# Patient Record
Sex: Male | Born: 1995 | Race: Black or African American | Hispanic: No | Marital: Single | Smoking: Never smoker
Health system: Southern US, Community
[De-identification: ages and names within clinical notes are randomized; demographics above are authoritative.]

---

## 2016-02-17 ENCOUNTER — Emergency Department (HOSPITAL_COMMUNITY)
Admission: EM | Admit: 2016-02-17 | Discharge: 2016-02-17 | Disposition: A | Discharge status: Home / Self Care | Payer: Self-pay | Attending: Emergency Medicine | Admitting: Emergency Medicine

## 2016-02-17 ENCOUNTER — Emergency Department (HOSPITAL_COMMUNITY): Payer: Self-pay

## 2016-02-17 ENCOUNTER — Encounter (HOSPITAL_COMMUNITY): Payer: Self-pay | Admitting: Emergency Medicine

## 2016-02-17 DIAGNOSIS — M25521 Pain in right elbow: Secondary | ICD-10-CM | POA: Insufficient documentation

## 2016-02-17 DIAGNOSIS — Y999 Unspecified external cause status: Secondary | ICD-10-CM | POA: Insufficient documentation

## 2016-02-17 DIAGNOSIS — W19XXXA Unspecified fall, initial encounter: Secondary | ICD-10-CM | POA: Insufficient documentation

## 2016-02-17 DIAGNOSIS — Y929 Unspecified place or not applicable: Secondary | ICD-10-CM | POA: Insufficient documentation

## 2016-02-17 DIAGNOSIS — Y9367 Activity, basketball: Secondary | ICD-10-CM | POA: Insufficient documentation

## 2016-02-17 MED ORDER — ACETAMINOPHEN 325 MG PO TABS
650.0000 mg | ORAL_TABLET | Freq: Once | ORAL | Status: AC
  Administered 2016-02-17: 650 mg via ORAL
  Filled 2016-02-17: qty 2

## 2016-02-17 NOTE — Discharge Instructions (Signed)
Read the information below.  Your x-rays did not show any acute fracture or dislocation. An ace wrap and arm sling were provided for comfort. Ice area for 20 minute increments and elevate arm for the next 48-72 hours.  You can take tylenol 650mg  every 6hrs or motrin 400mg  every 6hrs for pain relief.   Follow up with your primary care doctor if symptoms persist. I have provided the contact information for [facility]: [facility], please call to establish care and for re-check.  You may return to the Emergency Department at any time for worsening condition or any new symptoms that concern you. Return if you develop fever, worsening pain, change in color to your fingers, or loss of sensation in your fingers.

## 2016-02-17 NOTE — ED Provider Notes (Signed)
[facility]-EMERGENCY DEPT Provider Note   CSN: [id number] Arrival date & time: 02/17/16  2058     History   Chief Complaint Chief Complaint  Patient presents with  . Arm Pain    HPI Austin Deleon is a 20 y.o. male.  Austin Deleon is a 20 y.o. male presents to ED with complaint of right elbow pain. Pt was playing basketball today when he went up for a rebound and subsequently landed on his right elbow. Pain is worse with movement and improved with rest. He has associated weakness. He denies fever, swelling, change in color, warmth, or numbness. Denies head injury. No pertinent medical conditions. No treatments tried PTA.       No past medical history on file.  There are no active problems to display for this patient.   No past surgical history on file.     Home Medications    Prior to Admission medications   Not on File    Family History No family history on file.  Social History Social History  Substance Use Topics  . Smoking status: Never Smoker  . Smokeless tobacco: Never Used  . Alcohol use Not on file     Allergies   Review of patient's allergies indicates not on file.   Review of Systems Review of Systems  Constitutional: Negative for fever.  Musculoskeletal: Positive for arthralgias.  Skin: Negative for color change and wound.  Neurological: Positive for weakness. Negative for numbness.     Physical Exam Updated Vital Signs BP 106/66 (BP Location: Left Arm)   Pulse 65   Temp 98.4 F (36.9 C) (Oral)   Resp 15   Ht 5\' 5"  (1.651 m)   Wt 83.9 kg   SpO2 98%   BMI 30.79 kg/m   Physical Exam  Constitutional: He appears well-developed and well-nourished. No distress.  HENT:  Head: Normocephalic and atraumatic.  Eyes: Conjunctivae are normal. No scleral icterus.  Neck: Normal range of motion.  Pulmonary/Chest: Effort normal. No respiratory distress.  Abdominal: He exhibits no distension.  Musculoskeletal:       Right elbow:  Tenderness found.  No obvious deformity, swelling, redness, or warmth. TTP at proximal ulna. ROM, strength, sensation intact. 2+ radial pulses. Capillary refill <3seconds.   Neurological: He is alert.  Skin: Skin is warm and dry. He is not diaphoretic.  Psychiatric: He has a normal mood and affect. His behavior is normal.     ED Treatments / Results  Labs (all labs ordered are listed, but only abnormal results are displayed) Labs Reviewed - No data to display  EKG  EKG Interpretation None       Radiology Dg Elbow Complete Right  Result Date: 02/17/2016 CLINICAL DATA:  Status post fall, landing on right elbow, with right elbow pain. Initial encounter. EXAM: RIGHT ELBOW - COMPLETE 3+ VIEW COMPARISON:  None. FINDINGS: There is no evidence of fracture or dislocation. The visualized joint spaces are preserved. No significant joint effusion is identified. The soft tissues are unremarkable in appearance. IMPRESSION: No evidence of fracture or dislocation. Electronically Signed   By: Roanna RaiderJeffery  Chang M.D.   On: 02/17/2016 21:39    Procedures Procedures (including critical care time)  Medications Ordered in ED Medications  acetaminophen (TYLENOL) tablet 650 mg (650 mg Oral Given 02/17/16 2157)     Initial Impression / Assessment and Plan / ED Course  I have reviewed the triage vital signs and the nursing notes.  Pertinent labs & imaging results that were available  during my care of the patient were reviewed by me and considered in my medical decision making (see chart for details).  Clinical Course  Value Comment By Time  DG Elbow Complete Right No obvious fracture or dislocation Lona Kettle, PA-C 09/23 2150    Patient presents to ED with complaint of right elbow pain. Patient is afebrile and non-toxic appearing in NAD. VSS. TTP at proximal right ulna; ROM, strength, sensation, pulses intact. X-ray negative. Discussed results and plan with pt. Pt placed in ace wrap and sling.  Pain medicine given. Symptomatic management discussed to include RICE tylenol/motrin for pain relief. Follow up with PCP if sxs persist, contact information for [facility] for establishing PCP. Return precautions given. Pt voiced understanding and is agreeable.   Final Clinical Impressions(s) / ED Diagnoses   Final diagnoses:  Right elbow pain    New Prescriptions There are no discharge medications for this patient.    Lona Kettle, [place] 02/17/16 2328    Tilden Fossa, MD 02/20/16 [phone]

## 2016-02-17 NOTE — ED Triage Notes (Signed)
Pt reports right elbow pain after falling while playing basketball. Denies head injury. Pulse and sensation present.

## 2016-06-19 ENCOUNTER — Encounter (HOSPITAL_COMMUNITY): Payer: Self-pay

## 2016-06-19 ENCOUNTER — Emergency Department (HOSPITAL_COMMUNITY)
Admission: EM | Admit: 2016-06-19 | Discharge: 2016-06-19 | Disposition: A | Discharge status: Home / Self Care | Payer: Self-pay | Attending: Emergency Medicine | Admitting: Emergency Medicine

## 2016-06-19 DIAGNOSIS — K029 Dental caries, unspecified: Secondary | ICD-10-CM | POA: Insufficient documentation

## 2016-06-19 DIAGNOSIS — B9789 Other viral agents as the cause of diseases classified elsewhere: Secondary | ICD-10-CM

## 2016-06-19 DIAGNOSIS — J069 Acute upper respiratory infection, unspecified: Secondary | ICD-10-CM | POA: Insufficient documentation

## 2016-06-19 MED ORDER — IBUPROFEN 800 MG PO TABS: 800.0000 mg | ORAL_TABLET | Freq: Three times a day (TID) | ORAL | 0 refills | Status: DC | PRN

## 2016-06-19 MED ORDER — PENICILLIN V POTASSIUM 500 MG PO TABS: 500.0000 mg | ORAL_TABLET | Freq: Four times a day (QID) | ORAL | 0 refills | Status: DC

## 2016-06-19 MED ORDER — GUAIFENESIN ER 1200 MG PO TB12: 1.0000 | ORAL_TABLET | Freq: Two times a day (BID) | ORAL | 0 refills | Status: DC

## 2016-06-19 MED ORDER — ACETAMINOPHEN-CODEINE 120-12 MG/5ML PO SOLN: 10.0000 mL | ORAL | 0 refills | Status: DC | PRN

## 2016-06-19 NOTE — Discharge Instructions (Signed)
Return here as needed.  Increase your fluid intake, rest as much as possible.  You will need to follow-up with the oral surgeon

## 2016-06-19 NOTE — ED Triage Notes (Signed)
Patient c/o right upper dental pain and gum swelling since yesterday. Patient also c/o sinus congestion.

## 2016-06-20 NOTE — ED Provider Notes (Signed)
[facility]-EMERGENCY DEPT Provider Note   CSN: [id number] Arrival date & time: 06/19/16  1815     History   Chief Complaint Chief Complaint  Patient presents with  . Dental Pain  . sinus congestion    HPI Austin Deleon is a 21 y.o. male.  HPI Patient presents to the emergency department with dental pain from a lower right wisdom tooth that is damaged along with nasal congestion, cough for the last 2 days.  The patient states that he has seen a dentist about the tooth and was referred to an oral surgeon.  He states that he was wondering if he can get anything done about the tooth tonight. The patient denies chest pain, shortness of breath, headache,blurred vision, neck pain, fever, weakness, numbness, dizziness, anorexia, edema, abdominal pain, nausea, vomiting, diarrhea, rash, back pain, dysuria, hematemesis, bloody stool, near syncope, or syncope. History reviewed. No pertinent past medical history.  There are no active problems to display for this patient.   History reviewed. No pertinent surgical history.     Home Medications    Prior to Admission medications   Medication Sig Start Date End Date Taking? Authorizing Provider  acetaminophen-codeine 120-12 MG/5ML solution Take 10 mLs by mouth every 4 (four) hours as needed for moderate pain. 06/19/16   Nansi Birmingham, PA-C  Guaifenesin 1200 MG TB12 Take 1 tablet (1,200 mg total) by mouth 2 (two) times daily. 06/19/16   Charlestine Night, PA-C  ibuprofen (ADVIL,MOTRIN) 800 MG tablet Take 1 tablet (800 mg total) by mouth every 8 (eight) hours as needed. 06/19/16   Charlestine Night, PA-C  penicillin v potassium (VEETID) 500 MG tablet Take 1 tablet (500 mg total) by mouth 4 (four) times daily. 06/19/16   Charlestine Night, PA-C    Family History History reviewed. No pertinent family history.  Social History Social History  Substance Use Topics  . Smoking status: Never Smoker  . Smokeless tobacco: Never Used  . Alcohol  use No     Allergies   Patient has no known allergies.   Review of Systems Review of Systems All other systems negative except as documented in the HPI. All pertinent positives and negatives as reviewed in the HPI.  Physical Exam Updated Vital Signs BP 126/83 (BP Location: Left Arm)   Pulse 78   Temp 97.6 F (36.4 C) (Oral)   Resp 18   Ht 5\' 5"  (1.651 m)   Wt 77.1 kg   SpO2 99%   BMI 28.29 kg/m   Physical Exam  Constitutional: He is oriented to person, place, and time. He appears well-developed and well-nourished. No distress.  HENT:  Head: Normocephalic and atraumatic.  Nose: Mucosal edema and rhinorrhea present.  Mouth/Throat: Uvula is midline, oropharynx is clear and moist and mucous membranes are normal. No oral lesions. No trismus in the jaw. Abnormal dentition. Dental caries present. No dental abscesses or uvula swelling.    Eyes: Pupils are equal, round, and reactive to light.  Neck: Normal range of motion. Neck supple.  Cardiovascular: Normal rate, regular rhythm and normal heart sounds.  Exam reveals no gallop and no friction rub.   No murmur heard. Pulmonary/Chest: Effort normal and breath sounds normal. No respiratory distress. He has no wheezes.  Abdominal: Soft. Bowel sounds are normal. He exhibits no distension. There is no tenderness.  Neurological: He is alert and oriented to person, place, and time. He exhibits normal muscle tone. Coordination normal.  Skin: Skin is warm and dry. No rash noted. No erythema.  Psychiatric: He has a normal mood and affect. His behavior is normal.  Nursing note and vitals reviewed.    ED Treatments / Results  Labs (all labs ordered are listed, but only abnormal results are displayed) Labs Reviewed - No data to display  EKG  EKG Interpretation None       Radiology No results found.  Procedures Procedures (including critical care time)  Medications Ordered in ED Medications - No data to display   Initial  Impression / Assessment and Plan / ED Course  I have reviewed the triage vital signs and the nursing notes.  Pertinent labs & imaging results that were available during my care of the patient were reviewed by me and considered in my medical decision making (see chart for details).     Patient be started on treatment for viral URI along with the dental issue.  Told to follow-up with the oral surgeon.  Told to return here as needed.  Patient agrees the plan and all questions were answered  Final Clinical Impressions(s) / ED Diagnoses   Final diagnoses:  Pain due to dental caries  Viral URI with cough    New Prescriptions Discharge Medication List as of 06/19/2016  8:39 PM    START taking these medications   Details  acetaminophen-codeine 120-12 MG/5ML solution Take 10 mLs by mouth every 4 (four) hours as needed for moderate pain., Starting Wed 06/19/2016, Print    Guaifenesin 1200 MG TB12 Take 1 tablet (1,200 mg total) by mouth 2 (two) times daily., Starting Wed 06/19/2016, Print    ibuprofen (ADVIL,MOTRIN) 800 MG tablet Take 1 tablet (800 mg total) by mouth every 8 (eight) hours as needed., Starting Wed 06/19/2016, Print    penicillin v potassium (VEETID) 500 MG tablet Take 1 tablet (500 mg total) by mouth 4 (four) times daily., Starting Wed 06/19/2016, Print         Charlestine NightChristopher Isobelle Tuckett, PA-C 06/20/16 0200    Lyndal Pulleyaniel Knott, MD 06/20/16 [phone]

## 2016-12-01 ENCOUNTER — Emergency Department (HOSPITAL_COMMUNITY)
Admission: EM | Admit: 2016-12-01 | Discharge: 2016-12-01 | Disposition: A | Discharge status: Home / Self Care | Payer: Self-pay | Attending: Emergency Medicine | Admitting: Emergency Medicine

## 2016-12-01 ENCOUNTER — Encounter (HOSPITAL_COMMUNITY): Payer: Self-pay | Admitting: Nurse Practitioner

## 2016-12-01 DIAGNOSIS — Y999 Unspecified external cause status: Secondary | ICD-10-CM | POA: Insufficient documentation

## 2016-12-01 DIAGNOSIS — W500XXA Accidental hit or strike by another person, initial encounter: Secondary | ICD-10-CM | POA: Insufficient documentation

## 2016-12-01 DIAGNOSIS — Y929 Unspecified place or not applicable: Secondary | ICD-10-CM | POA: Insufficient documentation

## 2016-12-01 DIAGNOSIS — S00531A Contusion of lip, initial encounter: Secondary | ICD-10-CM | POA: Insufficient documentation

## 2016-12-01 DIAGNOSIS — Y9362 Activity, american flag or touch football: Secondary | ICD-10-CM | POA: Insufficient documentation

## 2016-12-01 NOTE — ED Triage Notes (Signed)
Pt is c/o lip pain secondary to upper lip lac that he reports he sustained while playing football. States his tetanus is up to date.

## 2016-12-01 NOTE — Discharge Instructions (Signed)
Tylenol and Motrin for pain.  Use ice on your lip to help reduce the swelling

## 2016-12-01 NOTE — ED Provider Notes (Signed)
[facility]-EMERGENCY DEPT Provider Note   CSN: [id number] Arrival date & time: 12/01/16  2221     History   Chief Complaint Chief Complaint  Patient presents with  . Lip Laceration    HPI Thorne Wirz is a 21 y.o. male.  HPI Patient presents to the emergency department with injury to the left upper lip.  The patient states his mind football and someone hit him in the upper lip with their elbow.  Patient was concerned there may be a laceration.  Patient has no other injuries.  He denies headache, blurred vision, chest pain, shortness breath, neck pain or syncope History reviewed. No pertinent past medical history.  There are no active problems to display for this patient.   History reviewed. No pertinent surgical history.     Home Medications    Prior to Admission medications   Medication Sig Start Date End Date Taking? Authorizing Provider  acetaminophen-codeine 120-12 MG/5ML solution Take 10 mLs by mouth every 4 (four) hours as needed for moderate pain. 06/19/16   Hildy Nicholl, Cristal Deer, PA-C  Guaifenesin 1200 MG TB12 Take 1 tablet (1,200 mg total) by mouth 2 (two) times daily. 06/19/16   Shanekqua Schaper, Cristal Deer, PA-C  ibuprofen (ADVIL,MOTRIN) 800 MG tablet Take 1 tablet (800 mg total) by mouth every 8 (eight) hours as needed. 06/19/16   Shonique Pelphrey, Cristal Deer, PA-C  penicillin v potassium (VEETID) 500 MG tablet Take 1 tablet (500 mg total) by mouth 4 (four) times daily. 06/19/16   Charlestine Night, PA-C    Family History History reviewed. No pertinent family history.  Social History Social History  Substance Use Topics  . Smoking status: Never Smoker  . Smokeless tobacco: Never Used  . Alcohol use No     Allergies   Patient has no known allergies.   Review of Systems Review of Systems  All other systems negative except as documented in the HPI. All pertinent positives and negatives as reviewed in the HPI. Physical Exam Updated Vital Signs BP 117/76 (BP Location:  Right Arm)   Pulse 61   Temp 97.7 F (36.5 C) (Oral)   Resp 14   SpO2 99%   Physical Exam  Constitutional: He is oriented to person, place, and time. He appears well-developed and well-nourished. No distress.  HENT:  Head: Normocephalic.  Mouth/Throat:    Eyes: Pupils are equal, round, and reactive to light.  Pulmonary/Chest: Effort normal.  Neurological: He is alert and oriented to person, place, and time.  Skin: Skin is warm and dry.  Psychiatric: He has a normal mood and affect.  Nursing note and vitals reviewed.    ED Treatments / Results  Labs (all labs ordered are listed, but only abnormal results are displayed) Labs Reviewed - No data to display  EKG  EKG Interpretation None       Radiology No results found.  Procedures Procedures (including critical care time)  Medications Ordered in ED Medications - No data to display   Initial Impression / Assessment and Plan / ED Course  I have reviewed the triage vital signs and the nursing notes.  Pertinent labs & imaging results that were available during my care of the patient were reviewed by me and considered in my medical decision making (see chart for details).     Patient has no laceration to the lip.  There is swelling noted and contusion.  Otherwise, there is no dental injury.  There is no intraoral lacerations  Final Clinical Impressions(s) / ED Diagnoses   Final  diagnoses:  None    New Prescriptions New Prescriptions   No medications on file     Kyra MangesLawyer, Quanell Loughney, PA-C 12/01/16 2328    Loren RacerYelverton, David, MD 12/03/16 2303

## 2017-02-17 ENCOUNTER — Encounter (HOSPITAL_COMMUNITY): Payer: Self-pay | Admitting: *Deleted

## 2017-02-17 ENCOUNTER — Emergency Department (HOSPITAL_COMMUNITY)
Admission: EM | Admit: 2017-02-17 | Discharge: 2017-02-17 | Disposition: A | Discharge status: Home / Self Care | Payer: Self-pay | Attending: Emergency Medicine | Admitting: Emergency Medicine

## 2017-02-17 DIAGNOSIS — R519 Headache, unspecified: Secondary | ICD-10-CM

## 2017-02-17 DIAGNOSIS — R059 Cough, unspecified: Secondary | ICD-10-CM

## 2017-02-17 DIAGNOSIS — J029 Acute pharyngitis, unspecified: Secondary | ICD-10-CM | POA: Insufficient documentation

## 2017-02-17 DIAGNOSIS — R51 Headache: Secondary | ICD-10-CM | POA: Insufficient documentation

## 2017-02-17 DIAGNOSIS — R05 Cough: Secondary | ICD-10-CM | POA: Insufficient documentation

## 2017-02-17 LAB — RAPID STREP SCREEN (MED CTR MEBANE ONLY): STREPTOCOCCUS, GROUP A SCREEN (DIRECT): NEGATIVE

## 2017-02-17 MED ORDER — ALBUTEROL SULFATE (2.5 MG/3ML) 0.083% IN NEBU: 5.0000 mg | INHALATION_SOLUTION | Freq: Once | RESPIRATORY_TRACT | Status: DC

## 2017-02-17 MED ORDER — KETOROLAC TROMETHAMINE 60 MG/2ML IM SOLN
30.0000 mg | Freq: Once | INTRAMUSCULAR | Status: AC
  Administered 2017-02-17: 30 mg via INTRAMUSCULAR
  Filled 2017-02-17: qty 2

## 2017-02-17 MED ORDER — IBUPROFEN 600 MG PO TABS: 600.0000 mg | ORAL_TABLET | Freq: Four times a day (QID) | ORAL | 0 refills | Status: DC | PRN

## 2017-02-17 MED ORDER — BENZONATATE 100 MG PO CAPS: 100.0000 mg | ORAL_CAPSULE | Freq: Three times a day (TID) | ORAL | 0 refills | Status: DC | PRN

## 2017-02-17 NOTE — ED Provider Notes (Signed)
[facility]-EMERGENCY DEPT Provider Note   CSN: [id number] Arrival date & time: 02/17/17  1456     History   Chief Complaint Chief Complaint  Patient presents with  . Headache  . Sore Throat    HPI Austin Deleon is a 21 y.o. male.  The history is provided by the patient and medical records. No language interpreter was used.   Austin Deleon is an otherwise healthy 21 y.o. male who presents to ED for sore throat x 2-3 days. Associated symptoms include headache, dry cough and chills. Unsure if he had a fever or not. He states that he described his symptoms and a coworker told him he might have a fever which is why he endorsed this earlier. He is afebrile at triage. No aggravating or alleviating factors noted. He took Tylenol yesterday which did not improve his symptoms. No medications taken prior to arrival today. No history of similar sxs. No sick contacts. Not a smoker.  History reviewed. No pertinent past medical history.  There are no active problems to display for this patient.   History reviewed. No pertinent surgical history.     Home Medications    Prior to Admission medications   Medication Sig Start Date End Date Taking? Authorizing Provider  acetaminophen-codeine 120-12 MG/5ML solution Take 10 mLs by mouth every 4 (four) hours as needed for moderate pain. 06/19/16   Lawyer, Cristal Deer, PA-C  Guaifenesin 1200 MG TB12 Take 1 tablet (1,200 mg total) by mouth 2 (two) times daily. 06/19/16   Lawyer, Cristal Deer, PA-C  ibuprofen (ADVIL,MOTRIN) 800 MG tablet Take 1 tablet (800 mg total) by mouth every 8 (eight) hours as needed. 06/19/16   Lawyer, Cristal Deer, PA-C  penicillin v potassium (VEETID) 500 MG tablet Take 1 tablet (500 mg total) by mouth 4 (four) times daily. 06/19/16   Charlestine Night, PA-C    Family History No family history on file.  Social History Social History  Substance Use Topics  . Smoking status: Never Smoker  . Smokeless tobacco: Never Used    . Alcohol use No     Allergies   Patient has no known allergies.   Review of Systems Review of Systems  Constitutional: Positive for chills.  HENT: Positive for congestion and sore throat.   Respiratory: Positive for cough. Negative for shortness of breath.   Cardiovascular: Negative for chest pain.  Gastrointestinal: Negative for abdominal pain, diarrhea, nausea and vomiting.     Physical Exam Updated Vital Signs BP 128/73 (BP Location: Left Arm)   Pulse 72   Temp 98.1 F (36.7 C) (Oral)   Resp 17   SpO2 100%   Physical Exam  Constitutional: He is oriented to person, place, and time. He appears well-developed and well-nourished. No distress.  HENT:  Head: Normocephalic and atraumatic.  OP with erythema, no exudates or tonsillar hypertrophy. + nasal congestion with mucosal edema.   Neck: Normal range of motion. Neck supple.  No meningeal signs.   Cardiovascular: Normal rate, regular rhythm and normal heart sounds.   Pulmonary/Chest: Effort normal.  Lungs are clear to auscultation bilaterally - no w/r/r  Abdominal: Soft. He exhibits no distension. There is no tenderness.  Musculoskeletal: Normal range of motion.  Neurological: He is alert and oriented to person, place, and time.  Speech clear and goal oriented. CN 2-12 grossly intact. No drift. Strength and sensation intact. Steady gait.   Skin: Skin is warm and dry. He is not diaphoretic.  Nursing note and vitals reviewed.    ED  Treatments / Results  Labs (all labs ordered are listed, but only abnormal results are displayed) Labs Reviewed  RAPID STREP SCREEN (NOT AT [facility])  CULTURE, GROUP A STREP [facility])    EKG  EKG Interpretation None       Radiology No results found.  Procedures Procedures (including critical care time)  Medications Ordered in ED Medications  albuterol (PROVENTIL) (2.5 MG/3ML) 0.083% nebulizer solution 5 mg (not administered)     Initial Impression / Assessment and Plan / ED  Course  I have reviewed the triage vital signs and the nursing notes.  Pertinent labs & imaging results that were available during my care of the patient were reviewed by me and considered in my medical decision making (see chart for details).    Austin Deleon is a 21 y.o. male who presents to ED for cough, congestion, sore throat and headache.   On exam, patient is afebrile, non-toxic appearing with a clear lung exam. Mild rhinorrhea and OP with erythema but no exudates or tonsillar hypertrophy.  Rapid strep negative.   Sxs today likely due to viral URI.Symptomatic home care instructions discussed. Rx for Tessalon and ibuprofen given. PCP follow up strongly encouraged if symptoms persist. Reasons to return to ER discussed. All questions answered.   Blood pressure 128/73, pulse 72, temperature 98.1 F (36.7 C), temperature source Oral, resp. rate 17, SpO2 100 %.   Final Clinical Impressions(s) / ED Diagnoses   Final diagnoses:  None    New Prescriptions New Prescriptions   No medications on file     Takara Sermons, Chase Picket, PA-C 02/17/17 1717    Pricilla Loveless, MD 02/18/17 [phone]

## 2017-02-17 NOTE — ED Notes (Signed)
Pt reports headache and generalized body aches with cough X 8 weeks.

## 2017-02-17 NOTE — ED Triage Notes (Signed)
Pt is here with headache, fever, sore throat, and cough

## 2017-02-17 NOTE — Discharge Instructions (Signed)
It was my pleasure taking care of you today!   Fortunately, we did not see evidence of serious infection and can treat your symptoms. Tessalon as needed for cough. Alternate between Tylenol and ibuprofen as needed for body aches / headaches.   Rest, drink plenty of fluids to be sure you are staying hydrated.   Please follow up with your primary doctor for discussion of your diagnoses and further evaluation after today's visit if symptoms persist longer than 7 days; Return to the ER for high fevers, difficulty breathing or other concerning symptoms

## 2017-02-19 LAB — CULTURE, GROUP A STREP (THRC)

## 2017-04-01 ENCOUNTER — Other Ambulatory Visit: Payer: Self-pay

## 2017-04-01 ENCOUNTER — Emergency Department (HOSPITAL_COMMUNITY)
Admission: EM | Admit: 2017-04-01 | Discharge: 2017-04-01 | Disposition: A | Discharge status: Home / Self Care | Payer: Self-pay | Attending: Emergency Medicine | Admitting: Emergency Medicine

## 2017-04-01 DIAGNOSIS — R519 Headache, unspecified: Secondary | ICD-10-CM

## 2017-04-01 DIAGNOSIS — R51 Headache: Secondary | ICD-10-CM | POA: Insufficient documentation

## 2017-04-01 LAB — CBC WITH DIFFERENTIAL/PLATELET
BASOS PCT: 0 %
Basophils Absolute: 0 10*3/uL (ref 0.0–0.1)
EOS ABS: 0.1 10*3/uL (ref 0.0–0.7)
EOS PCT: 1 %
HCT: 43 % (ref 39.0–52.0)
Hemoglobin: 14.8 g/dL (ref 13.0–17.0)
LYMPHS ABS: 2 10*3/uL (ref 0.7–4.0)
Lymphocytes Relative: 32 %
MCH: 28 pg (ref 26.0–34.0)
MCHC: 34.4 g/dL (ref 30.0–36.0)
MCV: 81.3 fL (ref 78.0–100.0)
MONOS PCT: 11 %
Monocytes Absolute: 0.7 10*3/uL (ref 0.1–1.0)
Neutro Abs: 3.5 10*3/uL (ref 1.7–7.7)
Neutrophils Relative %: 56 %
PLATELETS: 307 10*3/uL (ref 150–400)
RBC: 5.29 MIL/uL (ref 4.22–5.81)
RDW: 14.3 % (ref 11.5–15.5)
WBC: 6.4 10*3/uL (ref 4.0–10.5)

## 2017-04-01 LAB — BASIC METABOLIC PANEL
Anion gap: 6 (ref 5–15)
BUN: 15 mg/dL (ref 6–20)
CALCIUM: 8.7 mg/dL — AB (ref 8.9–10.3)
CO2: 28 mmol/L (ref 22–32)
CREATININE: 1.21 mg/dL (ref 0.61–1.24)
Chloride: 103 mmol/L (ref 101–111)
Glucose, Bld: 86 mg/dL (ref 65–99)
Potassium: 3.9 mmol/L (ref 3.5–5.1)
SODIUM: 137 mmol/L (ref 135–145)

## 2017-04-01 MED ORDER — KETOROLAC TROMETHAMINE 30 MG/ML IJ SOLN
30.0000 mg | Freq: Once | INTRAMUSCULAR | Status: AC
  Administered 2017-04-01: 30 mg via INTRAVENOUS
  Filled 2017-04-01: qty 1

## 2017-04-01 MED ORDER — METOCLOPRAMIDE HCL 5 MG/ML IJ SOLN
10.0000 mg | Freq: Once | INTRAMUSCULAR | Status: AC
  Administered 2017-04-01: 10 mg via INTRAVENOUS
  Filled 2017-04-01: qty 2

## 2017-04-01 MED ORDER — SODIUM CHLORIDE 0.9 % IV BOLUS (SEPSIS)
1000.0000 mL | Freq: Once | INTRAVENOUS | Status: AC
  Administered 2017-04-01: 1000 mL via INTRAVENOUS

## 2017-04-01 MED ORDER — METHYLPREDNISOLONE SODIUM SUCC 125 MG IJ SOLR
80.0000 mg | Freq: Once | INTRAMUSCULAR | Status: AC
  Administered 2017-04-01: 80 mg via INTRAVENOUS
  Filled 2017-04-01: qty 2

## 2017-04-01 MED ORDER — BUTALBITAL-APAP-CAFFEINE 50-325-40 MG PO TABS: 1.0000 | ORAL_TABLET | Freq: Four times a day (QID) | ORAL | 0 refills | Status: AC | PRN

## 2017-04-01 NOTE — ED Provider Notes (Signed)
[facility]-EMERGENCY DEPT Provider Note   CSN: [id number] Arrival date & time: 04/01/17  0855     History   Chief Complaint Chief Complaint  Patient presents with  . Headache    HPI Austin Deleon is a 21 y.o. male.  HPI   Austin AuRaekwon Banales is a 21 y.o. male, patient with no pertinent past medical history, presenting to the ED with headache for last two days. Pain is bilateral frontal, throbbing, 3-4/10, nonradiating. Has tried ibuprofen and "some medication and mom gave me" without relief.  States that this is similar to previous headaches, however, is lasting longer.  Accompanied by some minor photophobia.  Denies fever/chills, nausea/vomiting, vision abnormalities, neuro deficits, fall/trauma, dizziness, congestion, neck pain/stiffness, or any other complaints.     No past medical history on file.  There are no active problems to display for this patient.   No past surgical history on file.     Home Medications    Prior to Admission medications   Medication Sig Start Date End Date Taking? Authorizing Provider  acetaminophen-codeine 120-12 MG/5ML solution Take 10 mLs by mouth every 4 (four) hours as needed for moderate pain. Patient not taking: Reported on 04/01/2017 06/19/16   Charlestine NightLawyer, Christopher, PA-C  benzonatate (TESSALON) 100 MG capsule Take 1 capsule (100 mg total) by mouth 3 (three) times daily as needed for cough. Patient not taking: Reported on 04/01/2017 02/17/17   Ward, Chase PicketJaime Pilcher, PA-C  butalbital-acetaminophen-caffeine (FIORICET, ESGIC) 480-524-902450-325-40 MG tablet Take 1-2 tablets every 6 (six) hours as needed by mouth for headache. 04/01/17 04/01/18  Lakara Weiland C, PA-C  Guaifenesin 1200 MG TB12 Take 1 tablet (1,200 mg total) by mouth 2 (two) times daily. Patient not taking: Reported on 04/01/2017 06/19/16   Charlestine NightLawyer, Christopher, PA-C  ibuprofen (ADVIL,MOTRIN) 600 MG tablet Take 1 tablet (600 mg total) by mouth every 6 (six) hours as needed for  headache or moderate pain. Patient not taking: Reported on 04/01/2017 02/17/17   Ward, Chase PicketJaime Pilcher, PA-C  penicillin v potassium (VEETID) 500 MG tablet Take 1 tablet (500 mg total) by mouth 4 (four) times daily. Patient not taking: Reported on 04/01/2017 06/19/16   Charlestine NightLawyer, Christopher, PA-C    Family History No family history on file.  Social History Social History   Tobacco Use  . Smoking status: Never Smoker  . Smokeless tobacco: Never Used  Substance Use Topics  . Alcohol use: No  . Drug use: No     Allergies   Patient has no known allergies.   Review of Systems Review of Systems  Constitutional: Negative for chills, diaphoresis and fever.  HENT: Negative for congestion, sinus pressure and sinus pain.   Gastrointestinal: Negative for nausea and vomiting.  Musculoskeletal: Negative for neck pain and neck stiffness.  Skin: Negative for rash.  Neurological: Positive for headaches. Negative for dizziness, weakness, light-headedness and numbness.  All other systems reviewed and are negative.    Physical Exam Updated Vital Signs BP 118/71 (BP Location: Left Arm)   Pulse 74   Temp 99.3 F (37.4 C) (Oral)   Resp 16   SpO2 97%   Physical Exam  Constitutional: He appears well-developed and well-nourished. No distress.  HENT:  Head: Normocephalic and atraumatic.  Mouth/Throat: Oropharynx is clear and moist.  Eyes: Conjunctivae and EOM are normal. Pupils are equal, round, and reactive to light.  Neck: Neck supple.  Cardiovascular: Normal rate, regular rhythm, normal heart sounds and intact distal pulses.  Pulmonary/Chest: Effort normal and breath  sounds normal. No respiratory distress.  Abdominal: Soft. There is no tenderness. There is no guarding.  Musculoskeletal: He exhibits no edema.  Lymphadenopathy:    He has no cervical adenopathy.  Neurological: He is alert.  No sensory deficits.  No noted speech deficits. No aphasia. Patient handles oral secretions without  difficulty. No noted swallowing defects.  Equal grip strength bilaterally. Strength 5/5 in the upper extremities. Strength 5/5 with flexion and extension of the hips, knees, and ankles bilaterally.  Patellar DTRs 2+ bilaterally. Negative Romberg. No gait disturbance.  Coordination intact including heel to shin and finger to nose.  Cranial nerves III-XII grossly intact.  No facial droop.   Skin: Skin is warm and dry. Capillary refill takes less than 2 seconds. He is not diaphoretic.  Psychiatric: He has a normal mood and affect. His behavior is normal.  Nursing note and vitals reviewed.    ED Treatments / Results  Labs (all labs ordered are listed, but only abnormal results are displayed) Labs Reviewed  BASIC METABOLIC PANEL - Abnormal; Notable for the following components:      Result Value   Calcium 8.7 (*)    All other components within normal limits  CBC WITH DIFFERENTIAL/PLATELET    EKG  EKG Interpretation None       Radiology No results found.  Procedures Procedures (including critical care time)  Medications Ordered in ED Medications  sodium chloride 0.9 % bolus 1,000 mL (1,000 mLs Intravenous New Bag/Given 04/01/17 1050)  ketorolac (TORADOL) 30 MG/ML injection 30 mg (30 mg Intravenous Given 04/01/17 1054)  metoCLOPramide (REGLAN) injection 10 mg (10 mg Intravenous Given 04/01/17 1054)  methylPREDNISolone sodium succinate (SOLU-MEDROL) 125 mg/2 mL injection 80 mg (80 mg Intravenous Given 04/01/17 1055)     Initial Impression / Assessment and Plan / ED Course  I have reviewed the triage vital signs and the nursing notes.  Pertinent labs & imaging results that were available during my care of the patient were reviewed by me and considered in my medical decision making (see chart for details).     Patient presents with headache.  No neuro or functional deficits.  No preceding trauma.  No red flag symptoms.  Symptoms resolved with conservative management. The  patient was given instructions for home care as well as return precautions. Patient voices understanding of these instructions, accepts the plan, and is comfortable with discharge.    Final Clinical Impressions(s) / ED Diagnoses   Final diagnoses:  Bad headache    ED Discharge Orders        Ordered    butalbital-acetaminophen-caffeine (FIORICET, ESGIC) 50-325-40 MG tablet  Every 6 hours PRN     04/01/17 1129       Anselm PancoastJoy, Maan Zarcone C, PA-C 04/01/17 1135    Mancel BaleWentz, Elliott, MD 04/01/17 [phone]

## 2017-04-01 NOTE — Discharge Instructions (Signed)
You have been seen today for a headache. There were no abnormalities found on exam and lab results were reassuring.   Should pain recur: Antiinflammatory medications: Take 600 mg of ibuprofen every 6 hours or 440 mg (over the counter dose) to 500 mg (prescription dose) of naproxen every 12 hours for the next 3 days. After this time, these medications may be used as needed for pain. Take these medications with food to avoid upset stomach. Choose only one of these medications, do not take them together. Tylenol: Should you continue to have additional pain while taking the ibuprofen or naproxen, you may add in tylenol as needed. Your daily total maximum amount of tylenol from all sources should be limited to 4000mg /day for persons without liver problems, or 2000mg /day for those with liver problems.  Fioricet: May try the Fioricet instead for your headaches.  Please note that Fioricet contains Tylenol and the above limitations apply.   Hydration: Symptoms will be intensified and complicated by dehydration. Dehydration can also extend the duration of symptoms. Drink plenty of fluids and get plenty of rest. You should be drinking at least half a liter of water an hour to stay hydrated. Electrolyte drinks are also encouraged. You should be drinking enough fluids to make your urine light yellow, almost clear. If this is not the case, you are not drinking enough water. Please note that some of the treatments indicated below will not be effective if you are not adequately hydrated. Follow-up with your primary care provider for continued headaches.

## 2017-04-01 NOTE — ED Triage Notes (Addendum)
Headache x 3-4 days and pain unrelieved with OTC medication. Patient also reports mild photophobia. Pain 8/10.  Denies any other symptoms.

## 2017-12-26 ENCOUNTER — Encounter (HOSPITAL_COMMUNITY): Payer: Self-pay | Admitting: *Deleted

## 2017-12-26 ENCOUNTER — Emergency Department (HOSPITAL_COMMUNITY)
Admission: EM | Admit: 2017-12-26 | Discharge: 2017-12-27 | Disposition: A | Discharge status: Home / Self Care | Payer: Self-pay | Attending: Emergency Medicine | Admitting: Emergency Medicine

## 2017-12-26 ENCOUNTER — Other Ambulatory Visit: Payer: Self-pay

## 2017-12-26 DIAGNOSIS — Z5321 Procedure and treatment not carried out due to patient leaving prior to being seen by health care provider: Secondary | ICD-10-CM | POA: Insufficient documentation

## 2017-12-26 DIAGNOSIS — R509 Fever, unspecified: Secondary | ICD-10-CM | POA: Insufficient documentation
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
## 2017-12-27 NOTE — ED Notes (Signed)
Called for vitals X2 No answer 

## 2017-12-27 NOTE — ED Notes (Signed)
Called third time no answer

## 2017-12-27 NOTE — ED Notes (Signed)
Called twice for vitals no answer

## 2017-12-29 NOTE — ED Notes (Signed)
Follow up call made  No answer  12/29/17  1130  s Artina Minella rn

## 2018-05-30 ENCOUNTER — Other Ambulatory Visit: Payer: Self-pay

## 2018-05-30 ENCOUNTER — Encounter (HOSPITAL_COMMUNITY): Payer: Self-pay

## 2018-05-30 ENCOUNTER — Emergency Department (HOSPITAL_COMMUNITY)
Admission: EM | Admit: 2018-05-30 | Discharge: 2018-05-30 | Disposition: A | Discharge status: Home / Self Care | Payer: Self-pay | Attending: Emergency Medicine | Admitting: Emergency Medicine

## 2018-05-30 ENCOUNTER — Emergency Department (HOSPITAL_COMMUNITY): Payer: Self-pay

## 2018-05-30 DIAGNOSIS — Y9389 Activity, other specified: Secondary | ICD-10-CM | POA: Insufficient documentation

## 2018-05-30 DIAGNOSIS — Y92410 Unspecified street and highway as the place of occurrence of the external cause: Secondary | ICD-10-CM | POA: Insufficient documentation

## 2018-05-30 DIAGNOSIS — M79631 Pain in right forearm: Secondary | ICD-10-CM | POA: Insufficient documentation

## 2018-05-30 DIAGNOSIS — Y999 Unspecified external cause status: Secondary | ICD-10-CM | POA: Insufficient documentation

## 2018-05-30 DIAGNOSIS — S8001XA Contusion of right knee, initial encounter: Secondary | ICD-10-CM | POA: Insufficient documentation

## 2018-05-30 MED ORDER — IBUPROFEN 200 MG PO TABS
600.0000 mg | ORAL_TABLET | Freq: Once | ORAL | Status: AC
  Administered 2018-05-30: 600 mg via ORAL
  Filled 2018-05-30: qty 3

## 2018-05-30 MED ORDER — NAPROXEN 500 MG PO TABS: 500.0000 mg | ORAL_TABLET | Freq: Two times a day (BID) | ORAL | 0 refills | Status: DC
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
## 2020-12-31 ENCOUNTER — Emergency Department (HOSPITAL_COMMUNITY)
Admission: EM | Admit: 2020-12-31 | Discharge: 2020-12-31 | Disposition: A | Discharge status: Home / Self Care | Payer: Self-pay | Attending: Emergency Medicine | Admitting: Emergency Medicine

## 2020-12-31 ENCOUNTER — Other Ambulatory Visit: Payer: Self-pay

## 2020-12-31 ENCOUNTER — Emergency Department (HOSPITAL_COMMUNITY): Payer: Self-pay

## 2020-12-31 ENCOUNTER — Encounter (HOSPITAL_COMMUNITY): Payer: Self-pay

## 2020-12-31 DIAGNOSIS — M79671 Pain in right foot: Secondary | ICD-10-CM | POA: Insufficient documentation

## 2020-12-31 DIAGNOSIS — M79672 Pain in left foot: Secondary | ICD-10-CM | POA: Insufficient documentation

## 2020-12-31 DIAGNOSIS — W132XXA Fall from, out of or through roof, initial encounter: Secondary | ICD-10-CM | POA: Insufficient documentation
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
## 2020-12-31 NOTE — Discharge Instructions (Addendum)
At this time there does not appear to be the presence of an emergent medical condition, however there is always the potential for conditions to change. Please read and follow the below instructions.  Please return to the Emergency Department immediately for any new or worsening symptoms. Please be sure to follow up with your Primary Care Provider within one week regarding your visit today; please call their office to schedule an appointment even if you are feeling better for a follow-up visit. You may use the crutches given to you today to help keep weight off of your heels.  Please call the on-call orthopedic specialist Dr. Ave Filter for further evaluation treatment of your heel pain.  If your symptoms do not improve you may need a repeat x-ray or further imaging to rule out unseen injury such as fractures or ligamentous/tendon injury.  Go to the nearest Emergency Department immediately if: You have fever or chills Your foot is numb or tingling. Your foot or toes are swollen. Your foot or toes turn white or blue. You have warmth and redness along your foot. You have any new/concerning or worsening of symptoms   Please read the additional information packets attached to your discharge summary.  Do not take your medicine if  develop an itchy rash, swelling in your mouth or lips, or difficulty breathing; call 911 and seek immediate emergency medical attention if this occurs.  You may review your lab tests and imaging results in their entirety on your MyChart account.  Please discuss all results of fully with your primary care provider and other specialist at your follow-up visit.  Note: Portions of this text may have been transcribed using voice recognition software. Every effort was made to ensure accuracy; however, inadvertent computerized transcription errors may still be present.

## 2020-12-31 NOTE — ED Triage Notes (Signed)
Pt reports falling off the roof to concert and landing in standing position. Pain to bilateral heels. Patient has full range of motion.

## 2021-04-19 ENCOUNTER — Encounter (HOSPITAL_COMMUNITY): Payer: Self-pay

## 2021-04-19 ENCOUNTER — Emergency Department (HOSPITAL_COMMUNITY)
Admission: EM | Admit: 2021-04-19 | Discharge: 2021-04-19 | Disposition: A | Discharge status: Home / Self Care | Payer: Self-pay | Attending: Emergency Medicine | Admitting: Emergency Medicine

## 2021-04-19 DIAGNOSIS — J101 Influenza due to other identified influenza virus with other respiratory manifestations: Secondary | ICD-10-CM | POA: Insufficient documentation

## 2021-04-19 DIAGNOSIS — J111 Influenza due to unidentified influenza virus with other respiratory manifestations: Secondary | ICD-10-CM

## 2021-04-19 DIAGNOSIS — Z20822 Contact with and (suspected) exposure to covid-19: Secondary | ICD-10-CM | POA: Insufficient documentation

## 2021-04-19 LAB — RESP PANEL BY RT-PCR (FLU A&B, COVID) ARPGX2
Influenza A by PCR: POSITIVE — AB
Influenza B by PCR: NEGATIVE
SARS Coronavirus 2 by RT PCR: NEGATIVE

## 2021-04-19 MED ORDER — ACETAMINOPHEN 325 MG PO TABS
650.0000 mg | ORAL_TABLET | Freq: Once | ORAL | Status: AC
  Administered 2021-04-19: 650 mg via ORAL
  Filled 2021-04-19: qty 2

## 2021-04-19 NOTE — ED Triage Notes (Signed)
Pt states that he has had fever and body aches since yesterday and he has been around people with flu

## 2021-04-19 NOTE — ED Provider Notes (Signed)
[facility] EMERGENCY DEPARTMENT Provider Note   CSN: [id number] Arrival date & time: 04/19/21  0435     History Chief Complaint  Patient presents with   Fever    Austin Deleon is a 25 y.o. male presenting for evaluation of headache, body ache, cough.  Patient states his symptoms began yesterday.  He reports multiple flu exposures recently.  He is vaccinated for flu.  He denies nasal congestion, sore throat, chest pain, shortness of breath, nausea, vomiting, diarrhea.  He has no medical problems, takes no medications daily.  No history of asthma or COPD.  He has not taken anything for his symptoms.  HPI     History reviewed. No pertinent past medical history.  There are no problems to display for this patient.   History reviewed. No pertinent surgical history.     No family history on file.  Social History   Tobacco Use   Smoking status: Never   Smokeless tobacco: Never  Substance Use Topics   Alcohol use: No   Drug use: No    Home Medications Prior to Admission medications   Medication Sig Start Date End Date Taking? Authorizing Provider  naproxen (NAPROSYN) 500 MG tablet Take 1 tablet (500 mg total) by mouth 2 (two) times daily. 05/30/18   Janne Napoleon, NP    Allergies    Patient has no known allergies.  Review of Systems   Review of Systems  Constitutional:  Positive for chills and fever.  Respiratory:  Positive for cough.   Musculoskeletal:  Positive for myalgias.  Neurological:  Positive for headaches.  All other systems reviewed and are negative.  Physical Exam Updated Vital Signs BP 124/80 (BP Location: Right Arm)   Pulse 97   Temp (!) 102.8 F (39.3 C) (Oral)   Resp 17   SpO2 97%   Physical Exam Vitals and nursing note reviewed.  Constitutional:      General: He is not in acute distress.    Appearance: Normal appearance.     Comments: Resting in the chair no acute distress  HENT:     Head: Normocephalic and  atraumatic.  Eyes:     Conjunctiva/sclera: Conjunctivae normal.     Pupils: Pupils are equal, round, and reactive to light.  Cardiovascular:     Rate and Rhythm: Normal rate and regular rhythm.     Pulses: Normal pulses.  Pulmonary:     Effort: Pulmonary effort is normal. No respiratory distress.     Breath sounds: Normal breath sounds. No wheezing.     Comments: Speaking in full sentences.  Clear lung sounds in all fields. Abdominal:     General: There is no distension.     Palpations: Abdomen is soft. There is no mass.     Tenderness: There is no abdominal tenderness. There is no guarding or rebound.  Musculoskeletal:        General: Normal range of motion.     Cervical back: Normal range of motion and neck supple.  Skin:    General: Skin is warm and dry.     Capillary Refill: Capillary refill takes less than 2 seconds.  Neurological:     Mental Status: He is alert and oriented to person, place, and time.  Psychiatric:        Mood and Affect: Mood and affect normal.        Speech: Speech normal.        Behavior: Behavior normal.    ED  Results / Procedures / Treatments   Labs (all labs ordered are listed, but only abnormal results are displayed) Labs Reviewed  RESP PANEL BY RT-PCR (FLU A&B, COVID) ARPGX2 - Abnormal; Notable for the following components:      Result Value   Influenza A by PCR POSITIVE (*)    All other components within normal limits    EKG None  Radiology No results found.  Procedures Procedures   Medications Ordered in ED Medications  acetaminophen (TYLENOL) tablet 650 mg (650 mg Oral Given 04/19/21 0450)    ED Course  I have reviewed the triage vital signs and the nursing notes.  Pertinent labs & imaging results that were available during my care of the patient were reviewed by me and considered in my medical decision making (see chart for details).    MDM Rules/Calculators/A&P                           Patient presenting with 1 day  h/o viral sxs. Physical exam reassuring, patient appears nontoxic.  Pulmonary exam reassuring.  Doubt pneumonia, strep, other bacterial infection, or peritonsillar abscess.respiratory panel positive for flu, likely cause of patient's symptoms.  Will treat symptomatically.  Patient to follow-up with primary care as needed.  At this time, patient appears safe for discharge.  Return precautions given.  Patient states he understands and agrees to plan.  Final Clinical Impression(s) / ED Diagnoses Final diagnoses:  Flu    Rx / DC Orders ED Discharge Orders     None        Alveria Apley, PA-C 04/19/21 7124    Glynn Octave, MD 04/19/21 [phone]

## 2021-04-19 NOTE — Discharge Instructions (Signed)
You have the flu, which is a viral illness.  This should be treated symptomatically. Use Tylenol or ibuprofen as needed for headaches, fevers, or body aches. Use over-the-counter cough drops or syrups as needed Make sure you stay well-hydrated with water. Wash your hands frequently to prevent spread of infection. Follow-up with your primary care doctor in 1 week if your symptoms are not improving. Return to the emergency room if you develop chest pain, difficulty breathing, or any new or worsening symptoms.

## 2021-10-25 ENCOUNTER — Other Ambulatory Visit: Payer: Self-pay

## 2021-10-25 ENCOUNTER — Emergency Department (HOSPITAL_COMMUNITY)
Admission: EM | Admit: 2021-10-25 | Discharge: 2021-10-25 | Disposition: A | Discharge status: Home / Self Care | Payer: Self-pay | Attending: Emergency Medicine | Admitting: Emergency Medicine

## 2021-10-25 ENCOUNTER — Encounter (HOSPITAL_COMMUNITY): Payer: Self-pay

## 2021-10-25 DIAGNOSIS — Z041 Encounter for examination and observation following transport accident: Secondary | ICD-10-CM | POA: Diagnosis not present

## 2021-10-25 DIAGNOSIS — Y92410 Unspecified street and highway as the place of occurrence of the external cause: Secondary | ICD-10-CM | POA: Insufficient documentation

## 2021-10-25 MED ORDER — NAPROXEN 375 MG PO TABS: 375.0000 mg | ORAL_TABLET | Freq: Two times a day (BID) | ORAL | 0 refills | Status: AC

## 2021-10-25 MED ORDER — CYCLOBENZAPRINE HCL 10 MG PO TABS: 5.0000 mg | ORAL_TABLET | Freq: Two times a day (BID) | ORAL | 0 refills | Status: AC | PRN

## 2021-10-25 NOTE — Discharge Instructions (Signed)
Return to the emergency department immediately if you develop any of the following symptoms: You have numbness, tingling, or weakness in the arms or legs. You develop severe headaches not relieved with medicine. You have severe neck pain, especially tenderness in the middle of the back of your neck. You have changes in bowel or bladder control. There is increasing pain in any area of the body. You have shortness of breath, light-headedness, dizziness, or fainting. You have chest pain. You feel sick to your stomach (nauseous), throw up (vomit), or sweat. You have increasing abdominal discomfort. There is blood in your urine, stool, or vomit. You have pain in your shoulder (shoulder strap areas). You feel your symptoms are getting worse.   Follow up with [facility] (chiropractic) or [facility]
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
## 2022-04-30 IMAGING — CR DG OS CALCIS 2+V*R*
2 series · 2 of 2 positions shown · non-contrast
Comparison: None.

CLINICAL DATA: Fall from onto concrete yesterday, heel pain

EXAM:
LEFT FOOT - COMPLETE 3+ VIEW; RIGHT ANKLE - COMPLETE 3+ VIEW; LEFT
OS CALCIS - 2+ VIEW; RIGHT FOOT COMPLETE - 3+ VIEW; RIGHT OS CALCIS
- 2+ VIEW; LEFT ANKLE COMPLETE - 3+ VIEW

[x calcaneus axial right]
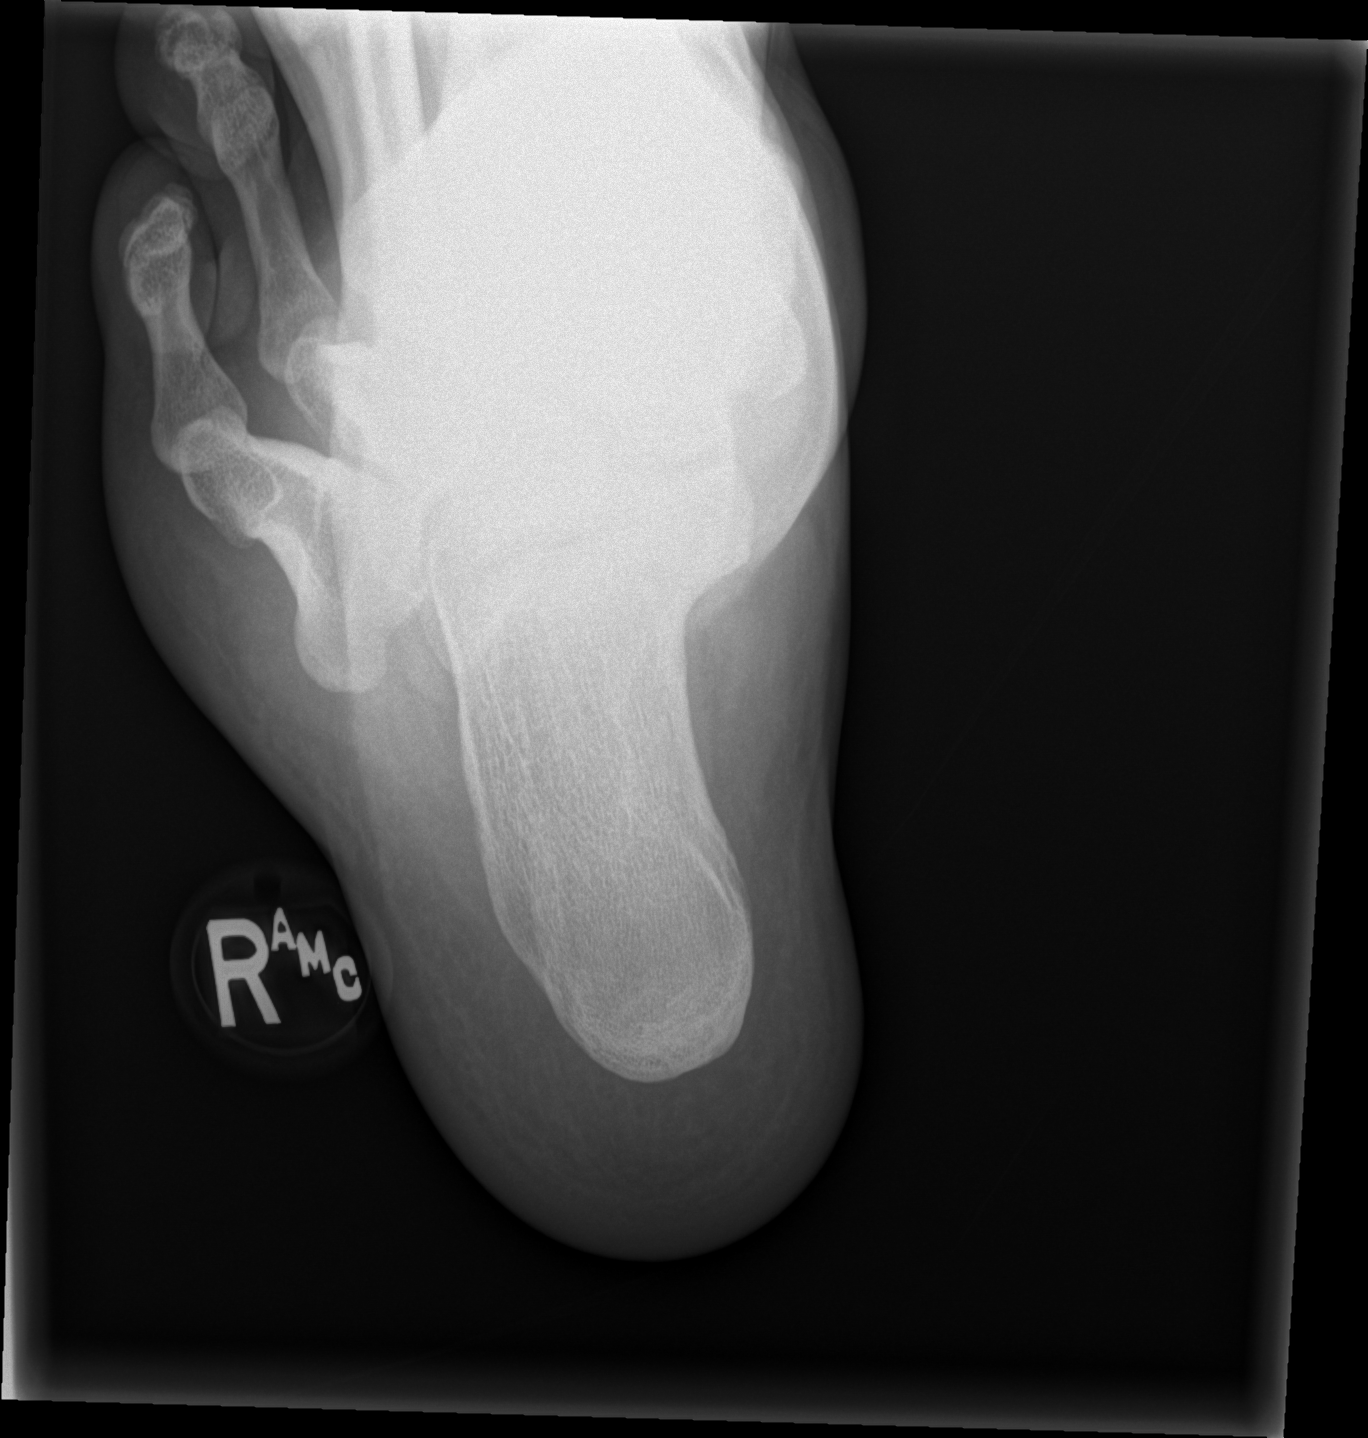

[x calcaneus lat right]
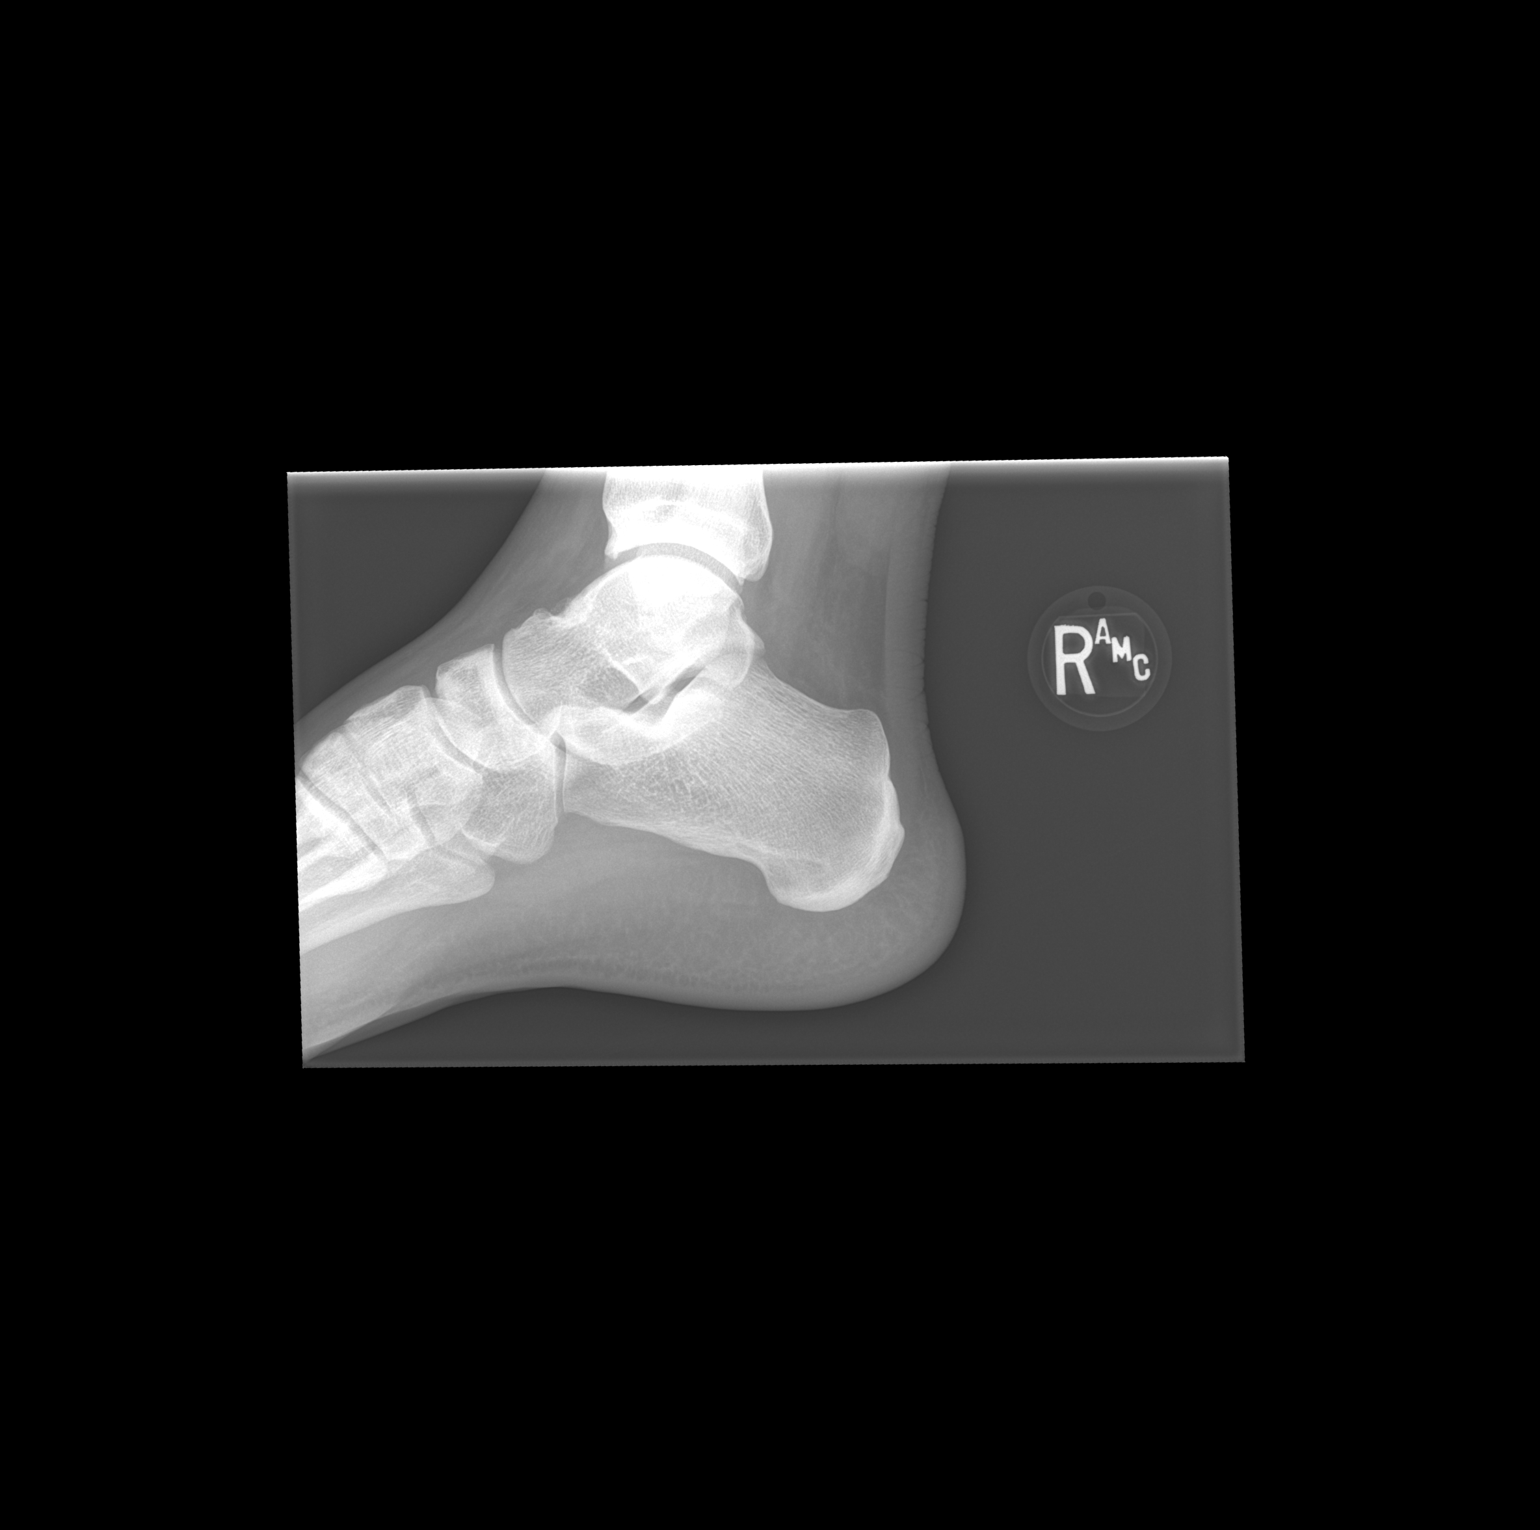

[2 of 2 positions shown; findings below may reference images not displayed]

FINDINGS: No fracture or dislocation of the bilateral ankles, calcanei, or
feet. There is a nonacute ossicle in the medial gutter of the right
ankle. Joint spaces are otherwise well preserved. Soft tissues are
unremarkable.
IMPRESSION: No fracture or dislocation of the bilateral ankles, calcanei, or
feet.

## 2022-04-30 IMAGING — CR DG FOOT COMPLETE 3+V*R*
3 series · 3 of 3 positions shown · non-contrast
Comparison: None.

CLINICAL DATA: Fall from onto concrete yesterday, heel pain

EXAM:
LEFT FOOT - COMPLETE 3+ VIEW; RIGHT ANKLE - COMPLETE 3+ VIEW; LEFT
OS CALCIS - 2+ VIEW; RIGHT FOOT COMPLETE - 3+ VIEW; RIGHT OS CALCIS
- 2+ VIEW; LEFT ANKLE COMPLETE - 3+ VIEW

[x foot ap right]
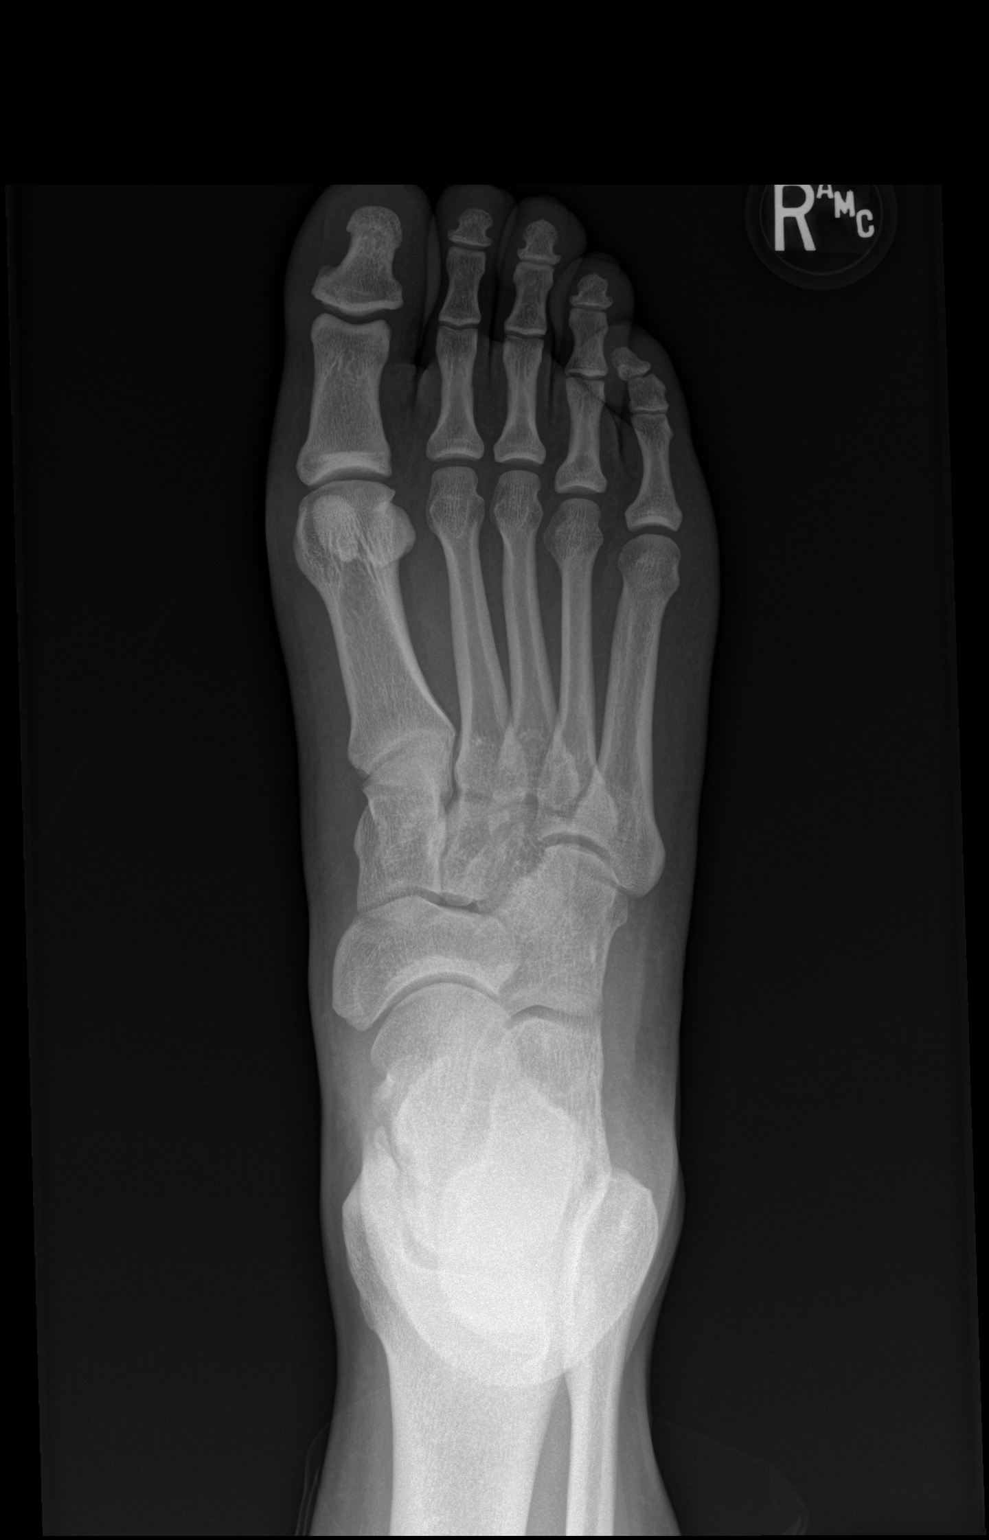

[x foot obl right]
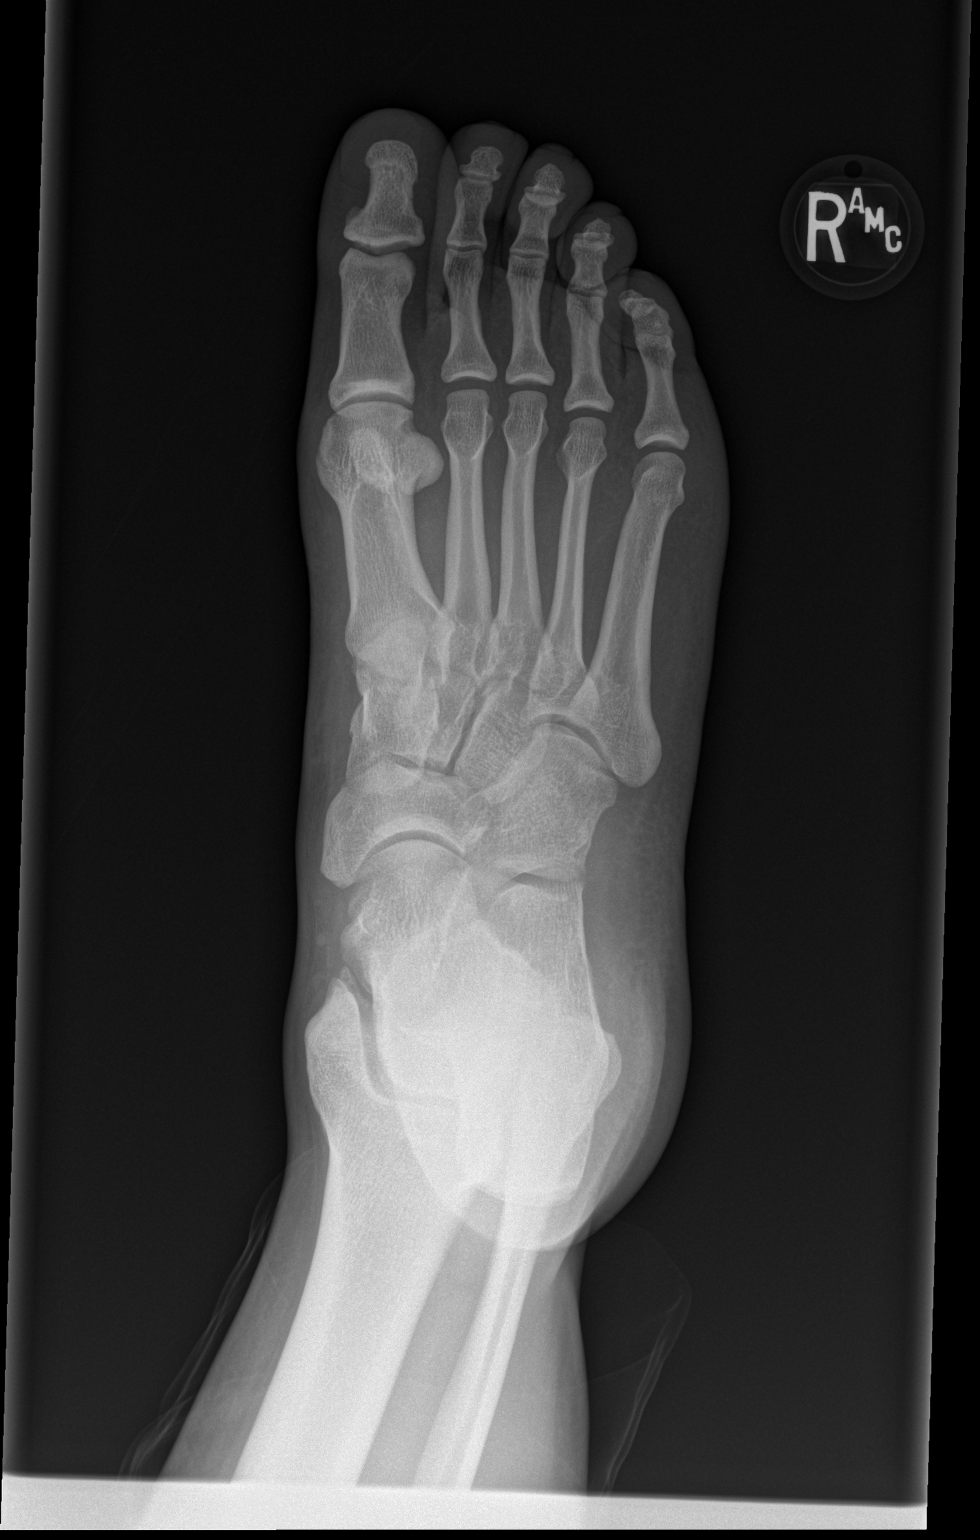

[x foot lat right]
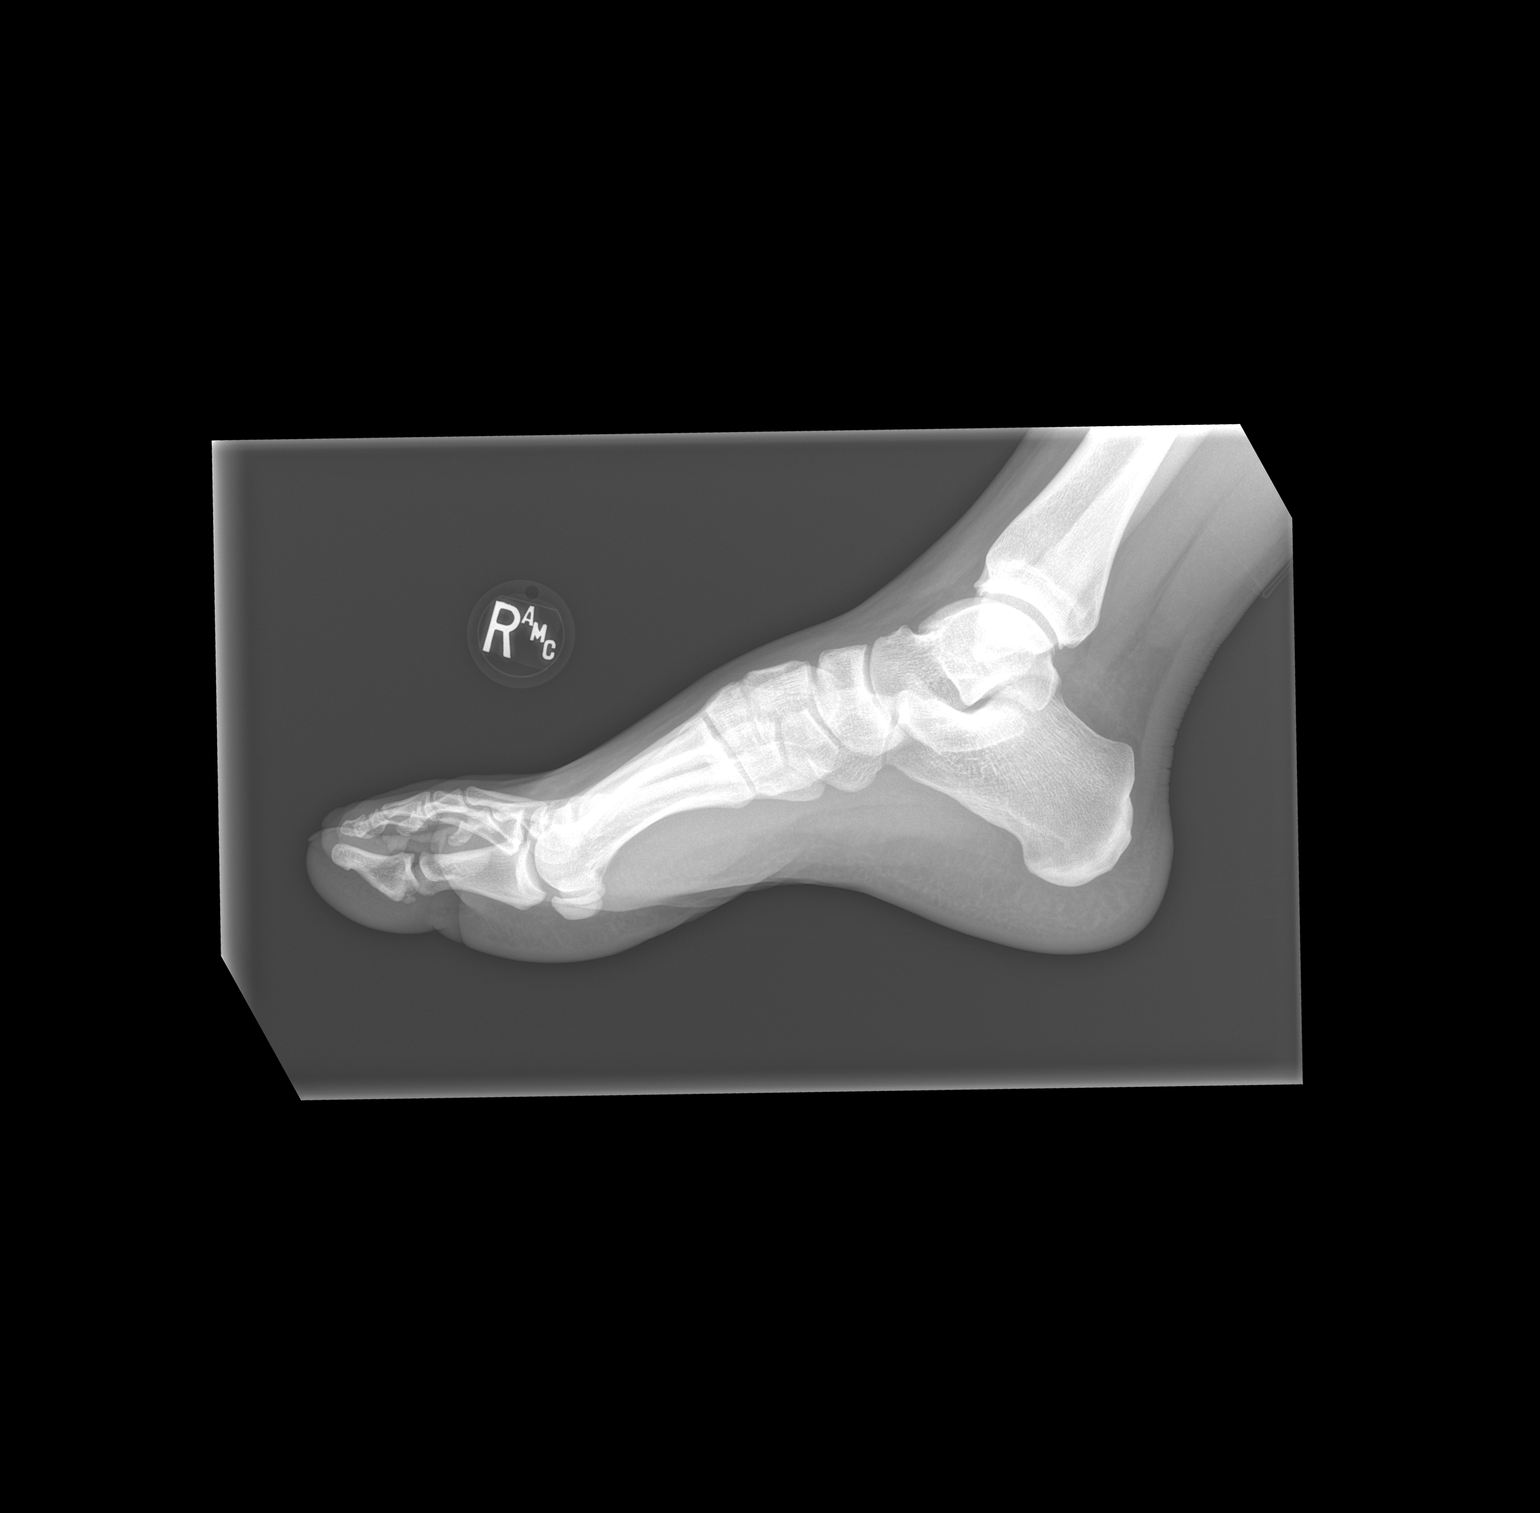

[3 of 3 positions shown; findings below may reference images not displayed]

FINDINGS: No fracture or dislocation of the bilateral ankles, calcanei, or
feet. There is a nonacute ossicle in the medial gutter of the right
ankle. Joint spaces are otherwise well preserved. Soft tissues are
unremarkable.
IMPRESSION: No fracture or dislocation of the bilateral ankles, calcanei, or
feet.

## 2022-04-30 IMAGING — CR DG ANKLE COMPLETE 3+V*R*
3 series · 3 of 3 positions shown · non-contrast
Comparison: None.

CLINICAL DATA: Fall from onto concrete yesterday, heel pain

EXAM:
LEFT FOOT - COMPLETE 3+ VIEW; RIGHT ANKLE - COMPLETE 3+ VIEW; LEFT
OS CALCIS - 2+ VIEW; RIGHT FOOT COMPLETE - 3+ VIEW; RIGHT OS CALCIS
- 2+ VIEW; LEFT ANKLE COMPLETE - 3+ VIEW

[x ankle ap right]
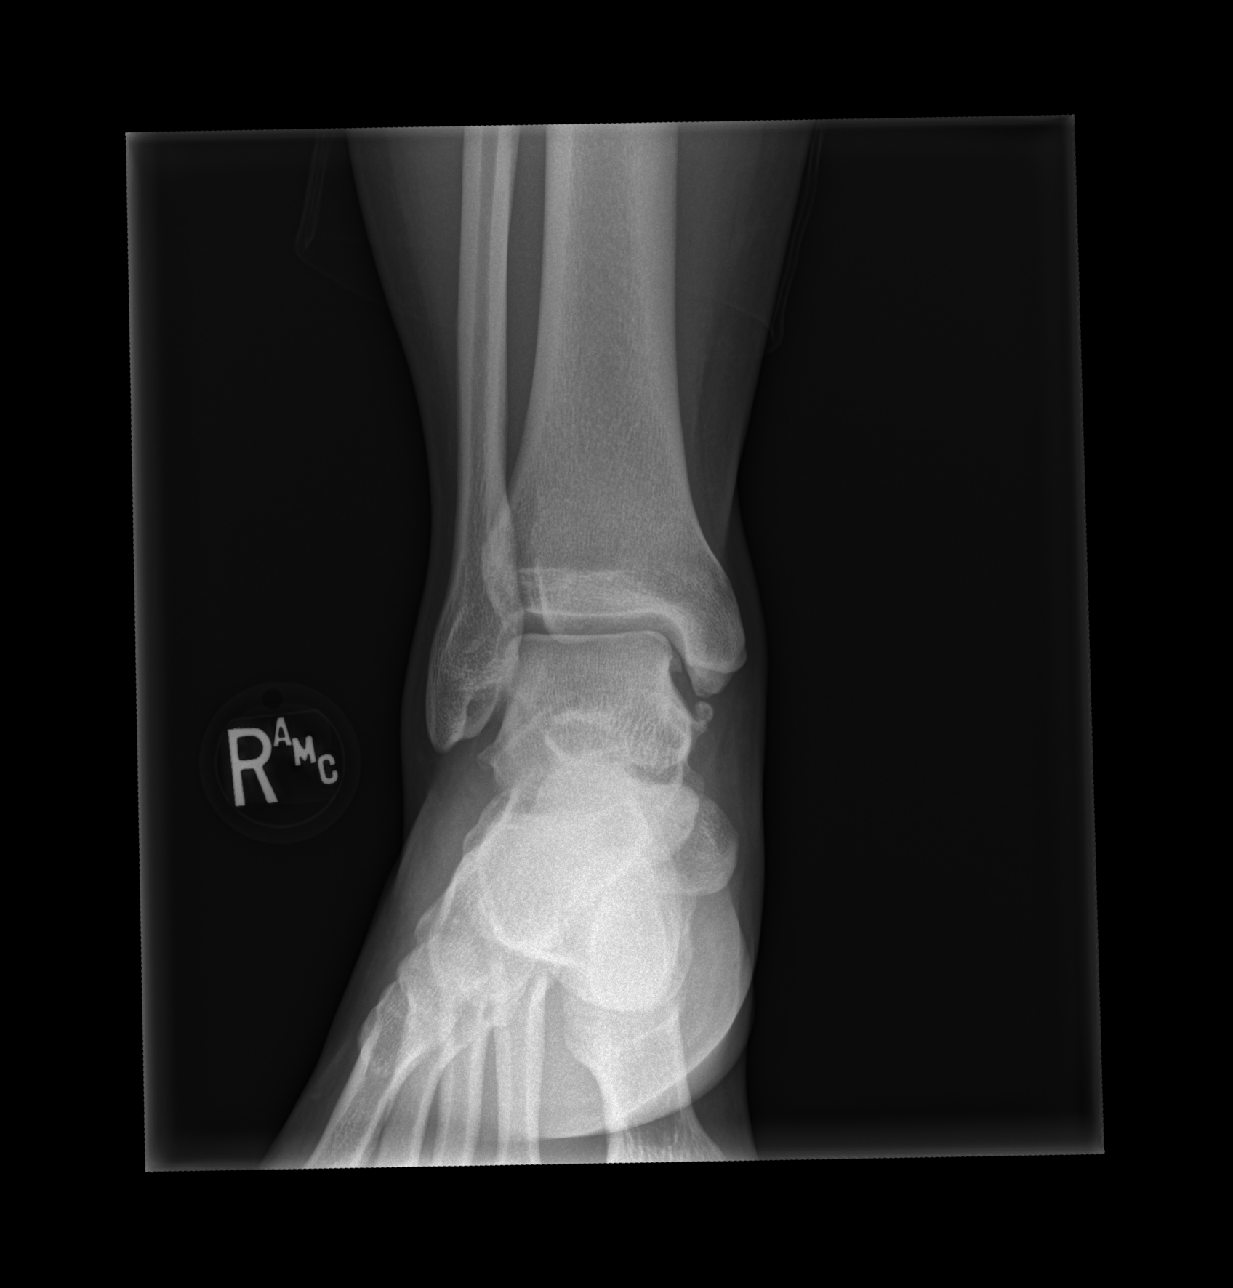

[x ankle obl right]
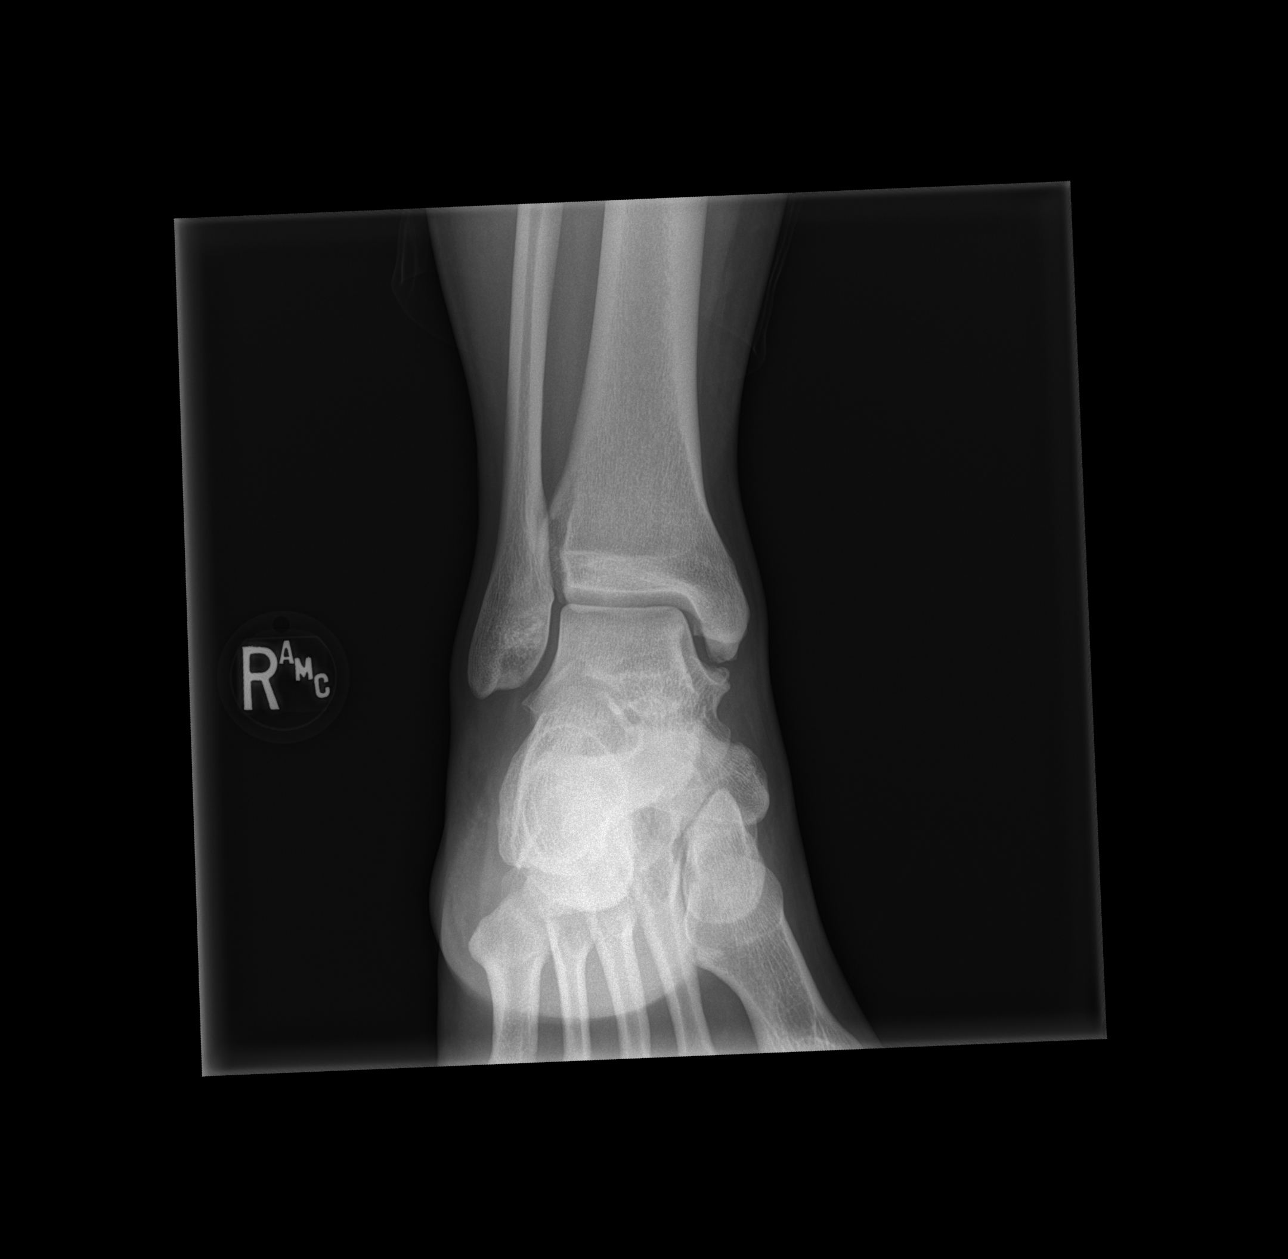

[x ankle lat right]
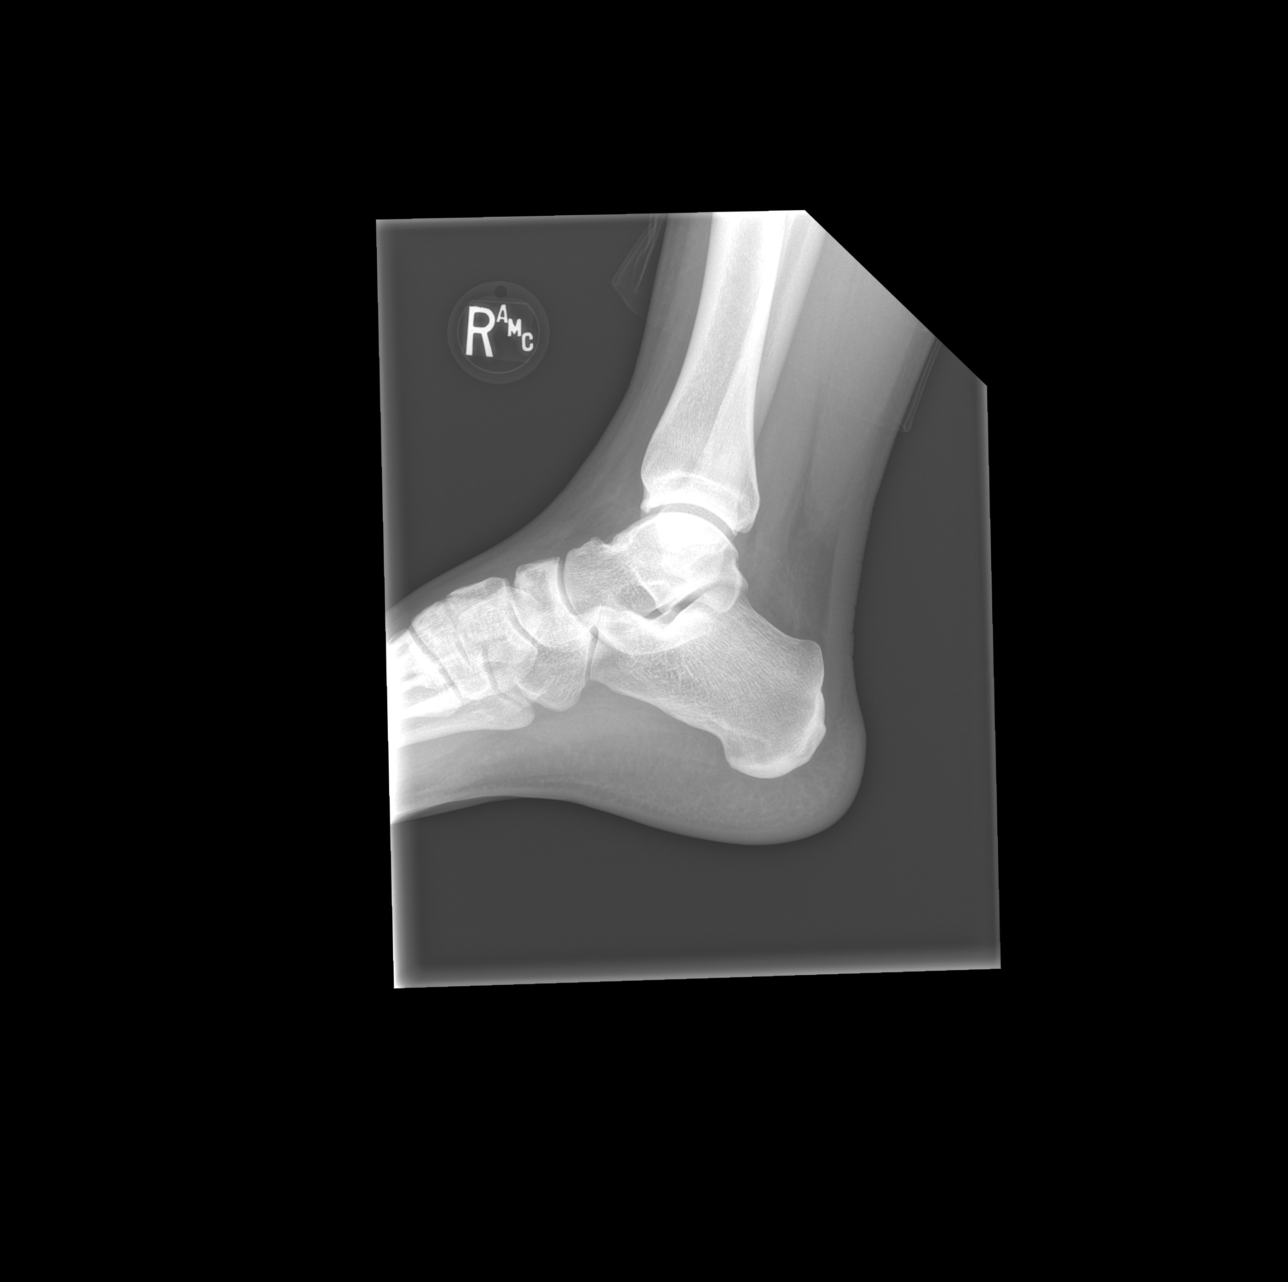

[3 of 3 positions shown; findings below may reference images not displayed]

FINDINGS: No fracture or dislocation of the bilateral ankles, calcanei, or
feet. There is a nonacute ossicle in the medial gutter of the right
ankle. Joint spaces are otherwise well preserved. Soft tissues are
unremarkable.
IMPRESSION: No fracture or dislocation of the bilateral ankles, calcanei, or
feet.

## 2022-12-14 ENCOUNTER — Other Ambulatory Visit: Payer: Self-pay

## 2022-12-14 ENCOUNTER — Emergency Department (HOSPITAL_COMMUNITY)
Admission: EM | Admit: 2022-12-14 | Discharge: 2022-12-14 | Disposition: A | Discharge status: Home / Self Care | Payer: Self-pay | Attending: Emergency Medicine | Admitting: Emergency Medicine

## 2022-12-14 DIAGNOSIS — T148XXA Other injury of unspecified body region, initial encounter: Secondary | ICD-10-CM

## 2022-12-14 DIAGNOSIS — S40022A Contusion of left upper arm, initial encounter: Secondary | ICD-10-CM | POA: Insufficient documentation
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
## 2022-12-14 NOTE — ED Notes (Signed)
Patient verbalizes understanding of discharge instructions. Opportunity for questioning and answers were provided. Pt discharged from ED. 

## 2022-12-14 NOTE — Discharge Instructions (Addendum)
Please use Tylenol or ibuprofen for pain.  You may use 600 mg ibuprofen every 6 hours or 1000 mg of Tylenol every 6 hours.  You may choose to alternate between the 2.  This would be most effective.  Not to exceed 4 g of Tylenol within 24 hours.  Not to exceed 3200 mg ibuprofen 24 hours.  You can use an Ace wrap, ice to help with the bruising, swelling on the affected arm.
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
